# Patient Record
Sex: Male | Born: 1970 | Race: White | Hispanic: No | Marital: Single
Health system: Southern US, Community
[De-identification: ages and names within clinical notes are randomized; demographics above are authoritative.]

---

## 1999-06-15 ENCOUNTER — Emergency Department (HOSPITAL_COMMUNITY): Admission: EM | Admit: 1999-06-15 | Discharge: 1999-06-15 | Payer: Self-pay | Admitting: Emergency Medicine

## 1999-06-15 ENCOUNTER — Encounter: Payer: Self-pay | Admitting: Emergency Medicine

## 1999-06-16 ENCOUNTER — Observation Stay (HOSPITAL_COMMUNITY): Admission: EM | Admit: 1999-06-16 | Discharge: 1999-06-16 | Payer: Self-pay | Admitting: Emergency Medicine

## 2014-06-30 ENCOUNTER — Other Ambulatory Visit: Payer: Self-pay | Admitting: Orthopedic Surgery

## 2014-06-30 DIAGNOSIS — G8929 Other chronic pain: Secondary | ICD-10-CM

## 2014-06-30 DIAGNOSIS — M533 Sacrococcygeal disorders, not elsewhere classified: Principal | ICD-10-CM

## 2014-07-09 ENCOUNTER — Ambulatory Visit
Admission: RE | Admit: 2014-07-09 | Discharge: 2014-07-09 | Disposition: A | Discharge status: Home / Self Care | Payer: Worker's Compensation | Source: Ambulatory Visit | Attending: Orthopedic Surgery | Admitting: Orthopedic Surgery

## 2014-07-09 DIAGNOSIS — M533 Sacrococcygeal disorders, not elsewhere classified: Principal | ICD-10-CM

## 2014-07-09 DIAGNOSIS — G8929 Other chronic pain: Secondary | ICD-10-CM

## 2014-07-09 MED ORDER — METHYLPREDNISOLONE ACETATE 40 MG/ML INJ SUSP (RADIOLOG
120.0000 mg | Freq: Once | INTRAMUSCULAR | Status: AC
  Administered 2014-07-09: 120 mg via INTRA_ARTICULAR

## 2015-05-02 IMAGING — CT CT BIOPSY
1 series · 15 of 24 positions shown, 19 images · non-contrast
Comparison: none

CLINICAL DATA: Right-sided sacroiliitis.

[Series 3: pelvis standard · axial · 0.70mm/px · z∈[-61,-9]mm · 15 of 24 slices shown, 19 images]
[im 2/24  soft-tissue]
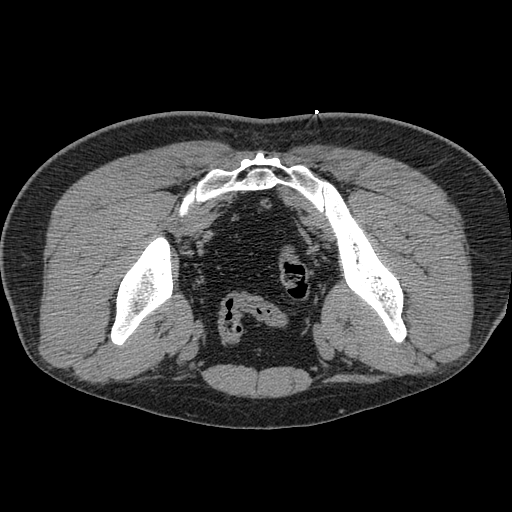
[im 2/24  bone]
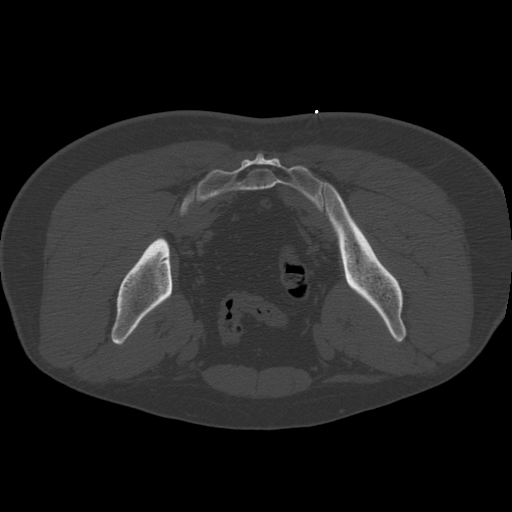
[im 4/24  soft-tissue]
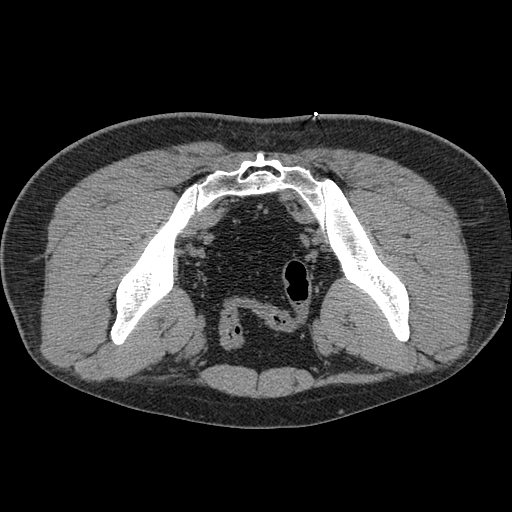
[im 6/24  soft-tissue]
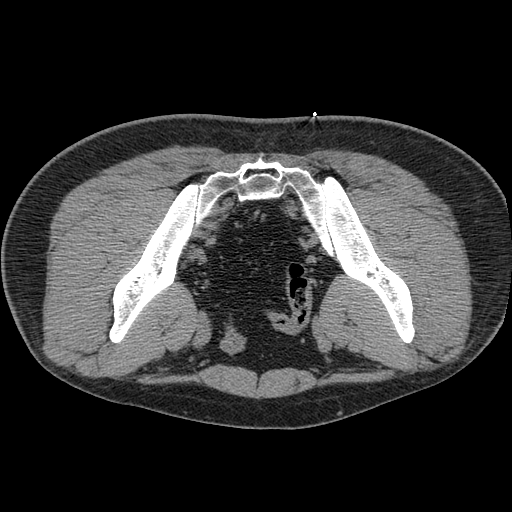
[im 7/24  soft-tissue]
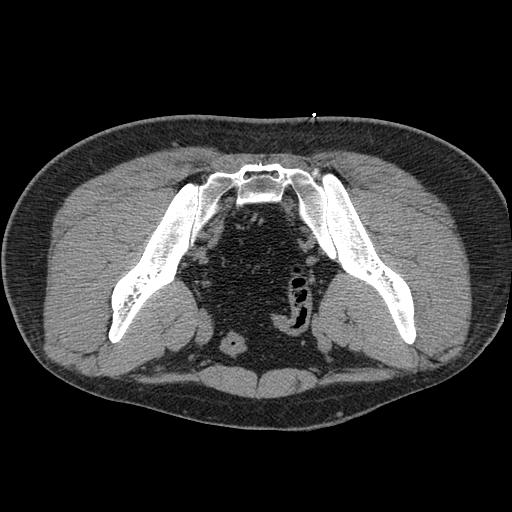
[im 9/24  soft-tissue]
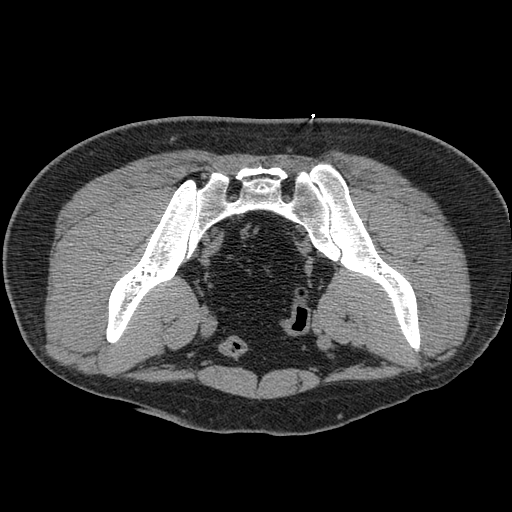
[im 11/24  soft-tissue]
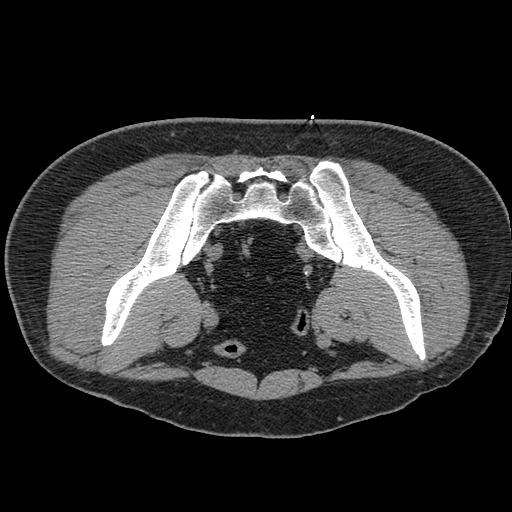
[im 13/24  soft-tissue]
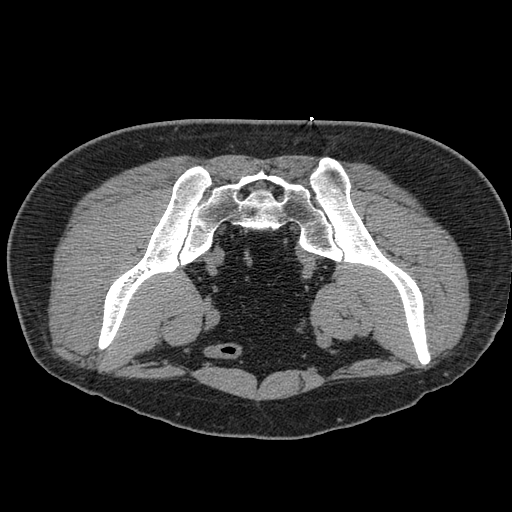
[im 14/24  soft-tissue]
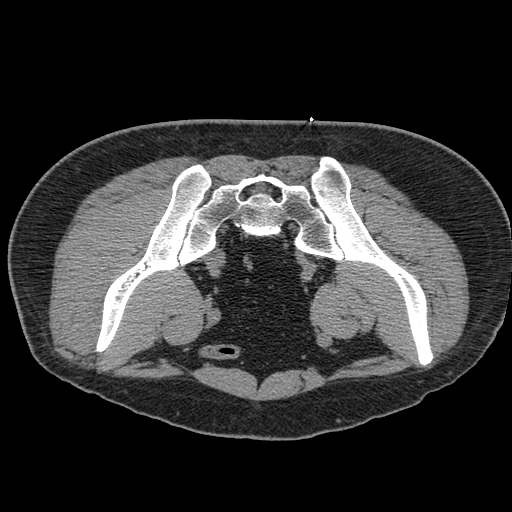
[im 16/24  soft-tissue]
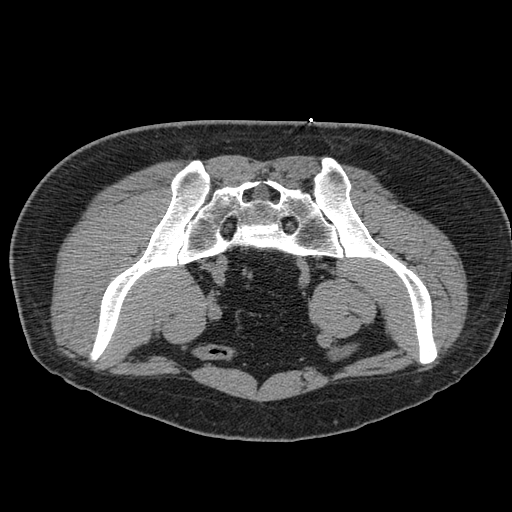
[im 16/24  bone]
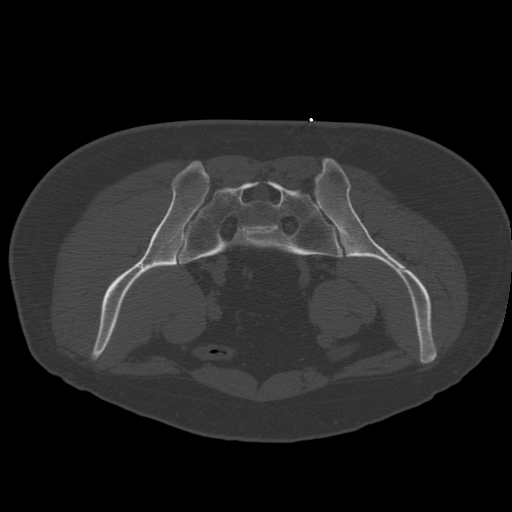
[im 18/24  soft-tissue]
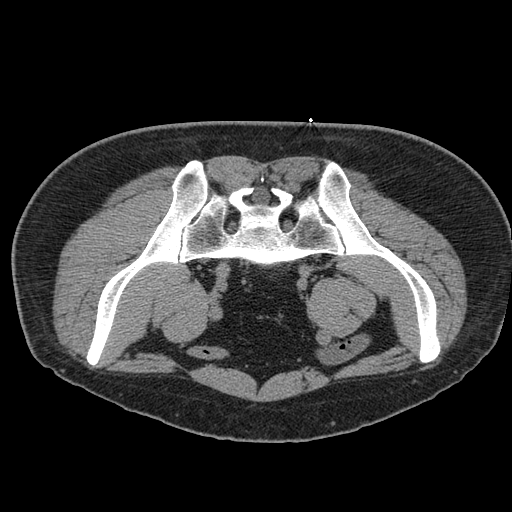
[im 19/24  soft-tissue]
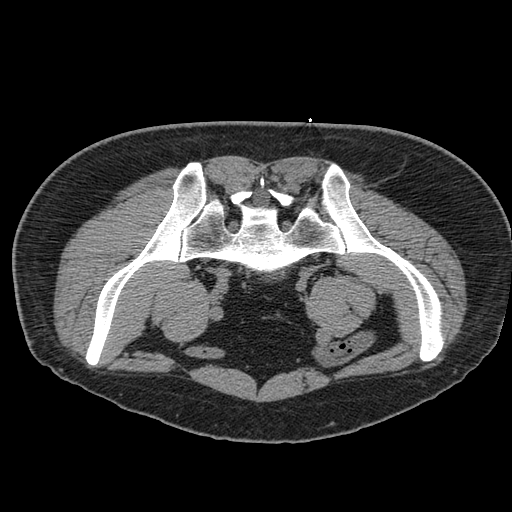
[im 20/24  lung]
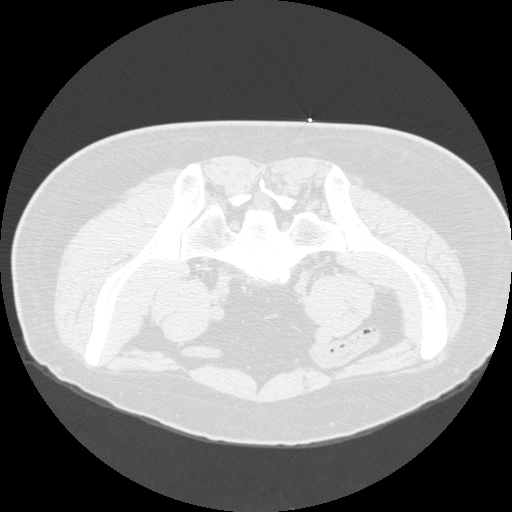
[im 21/24  soft-tissue]
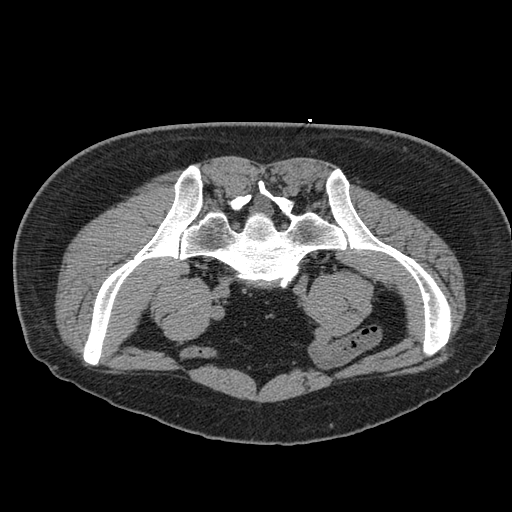
[im 21/24  lung]
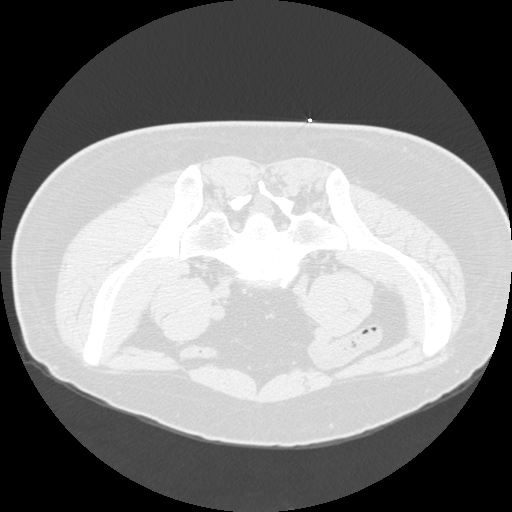
[im 22/24  lung]
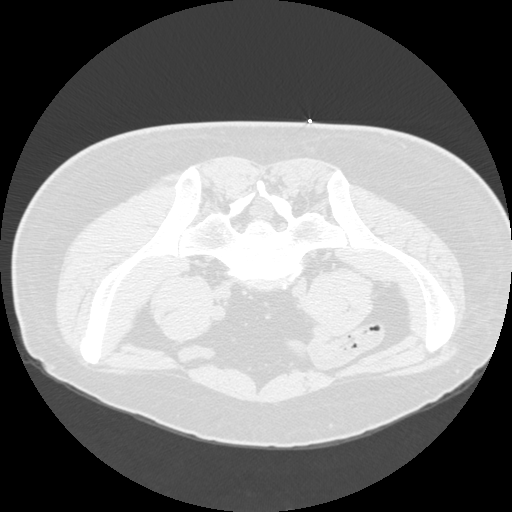
[im 23/24  soft-tissue]
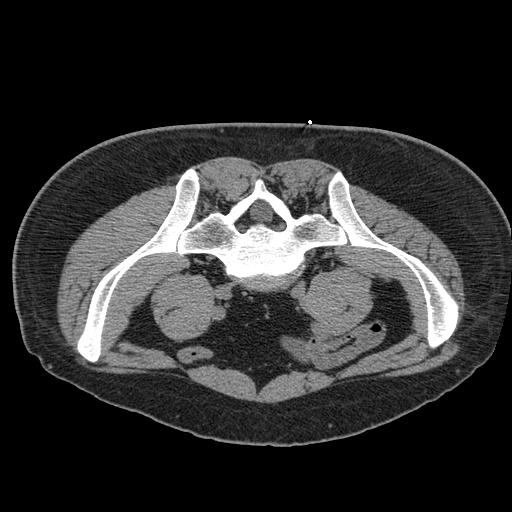
[im 23/24  lung]
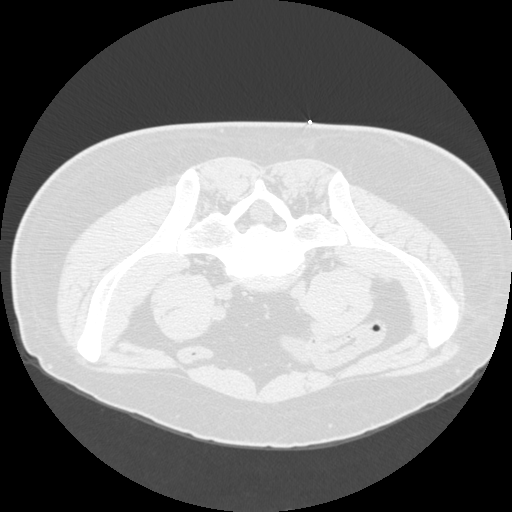

[15 of 24 positions shown; findings below may reference images not displayed]

EXAM:
CT GUIDED right sacro iliac joint steroid injection

ANESTHESIA/SEDATION:
Local

PROCEDURE:
The procedure risks, benefits, and alternatives were explained to
the patient. Questions regarding the procedure were encouraged and
answered. The patient understands and consents to the procedure.

The posterior right pelvis was prepped with dated [place] a sterile
fashion, and a sterile drape was applied covering the operative
field. A sterile gown and sterile gloves were used for the
procedure. Local anesthesia was provided with 1% Lidocaine.

The right SI joint was localized under CT. A 22 gauge spinal needle
was then advanced into the joint under intermittent CT guidance.
mL of Omnipaque 180 was injected to confirm placement of the needle
tip inside the joint.

I then injected 120 mg Depo-Medrol and 1.5 mL 1% lidocaine. This
resulted in concordant pain.

Complications: None
FINDINGS: Concordant right-sided pain with the injection and side the SI
joint.
IMPRESSION: Technically successful CT-guided steroid injection of the right SI
joint.
